# Patient Record
Sex: Female | Born: 1975 | Race: White | Hispanic: No | Marital: Married
Health system: Southern US, Community
[De-identification: ages and names within clinical notes are randomized; demographics above are authoritative.]

---

## 2005-12-07 ENCOUNTER — Encounter: Admission: RE | Admit: 2005-12-07 | Discharge: 2005-12-07 | Payer: Self-pay | Admitting: Family Medicine

## 2006-08-29 ENCOUNTER — Ambulatory Visit (HOSPITAL_COMMUNITY): Admission: RE | Admit: 2006-08-29 | Discharge: 2006-08-29 | Payer: Self-pay | Admitting: Neurological Surgery

## 2007-04-22 ENCOUNTER — Ambulatory Visit (HOSPITAL_COMMUNITY): Admission: RE | Admit: 2007-04-22 | Discharge: 2007-04-22 | Payer: Self-pay | Admitting: Surgery

## 2007-04-30 ENCOUNTER — Encounter: Admission: RE | Admit: 2007-04-30 | Discharge: 2007-04-30 | Payer: Self-pay | Admitting: Surgery

## 2007-05-08 ENCOUNTER — Ambulatory Visit (HOSPITAL_COMMUNITY): Admission: RE | Admit: 2007-05-08 | Discharge: 2007-05-08 | Payer: Self-pay | Admitting: Surgery

## 2007-07-08 ENCOUNTER — Ambulatory Visit (HOSPITAL_COMMUNITY): Admission: RE | Admit: 2007-07-08 | Discharge: 2007-07-08 | Payer: Self-pay | Admitting: Surgery

## 2007-08-20 ENCOUNTER — Encounter: Admission: RE | Admit: 2007-08-20 | Discharge: 2007-08-20 | Payer: Self-pay | Admitting: Surgery

## 2007-09-03 ENCOUNTER — Ambulatory Visit (HOSPITAL_COMMUNITY): Admission: RE | Admit: 2007-09-03 | Discharge: 2007-09-04 | Payer: Self-pay | Admitting: Surgery

## 2007-09-17 ENCOUNTER — Encounter: Admission: RE | Admit: 2007-09-17 | Discharge: 2007-09-17 | Payer: Self-pay | Admitting: Surgery

## 2009-07-02 ENCOUNTER — Encounter: Admission: RE | Admit: 2009-07-02 | Discharge: 2009-07-02 | Payer: Self-pay | Admitting: Surgery

## 2010-04-04 ENCOUNTER — Inpatient Hospital Stay (HOSPITAL_COMMUNITY)
Admission: EM | Admit: 2010-04-04 | Discharge: 2010-04-08 | Payer: Self-pay | Source: Home / Self Care | Attending: Internal Medicine | Admitting: Internal Medicine

## 2010-04-06 ENCOUNTER — Encounter: Payer: Self-pay | Admitting: Internal Medicine

## 2010-04-08 ENCOUNTER — Encounter (INDEPENDENT_AMBULATORY_CARE_PROVIDER_SITE_OTHER): Payer: Self-pay | Admitting: *Deleted

## 2010-04-20 ENCOUNTER — Ambulatory Visit: Payer: Self-pay | Admitting: Gastroenterology

## 2010-04-20 ENCOUNTER — Encounter (INDEPENDENT_AMBULATORY_CARE_PROVIDER_SITE_OTHER): Payer: Self-pay | Admitting: *Deleted

## 2010-05-31 NOTE — Letter (Signed)
Summary: New Patient letter  [facility]  [address] [place], [place] [address]   Phone: [phone]  Fax: [phone]       04/08/2010 MRN: [id number]  [facility] [address] [place], [place]  [address]  [place]  Dear Ms. Larsh,  Welcome to the Gastroenterology Division at [facility].    You are scheduled to see Willette Cluster on 04-20-10 at 10:00a.m. on the 3rd floor at [facility], [address]  We ask that you try to arrive at our office 15 minutes prior to your appointment time to allow for check-in.  We would like you to complete the enclosed self-administered evaluation form prior to your visit and bring it with you on the day of your appointment.  We will review it with you.  Also, please bring a complete list of all your medications or, if you prefer, bring the medication bottles and we will list them.  Please bring your insurance card so that we may make a copy of it.  If your insurance requires a referral to see a specialist, please bring your referral form from your primary care physician.  Co-payments are due at the time of your visit and may be paid by cash, check or credit card.     Your office visit will consist of a consult with your physician (includes a physical exam), any laboratory testing he/she may order, scheduling of any necessary diagnostic testing (e.g. x-ray, ultrasound, CT-scan), and scheduling of a procedure (e.g. Endoscopy, Colonoscopy) if required.  Please allow enough time on your schedule to allow for any/all of these possibilities.    If you cannot keep your appointment, please call [phone] to cancel or reschedule prior to your appointment date.  This allows [place] the opportunity to schedule an appointment for another patient in need of care.  If you do not cancel or reschedule by 5 p.m. the business day prior to your appointment date, you will be charged a $50.00 late cancellation/no-show fee.    Thank you for choosing  [facility] for your medical needs.  We appreciate the opportunity to care for you.  Please visit [place] at our website  to learn more about our practice.                     Sincerely,                                                             The Gastroenterology Division

## 2010-05-31 NOTE — Procedures (Signed)
Summary: Upper Endoscopy  Patient: [name] Note: All result statuses are Final unless otherwise noted.  Tests: (1) Upper Endoscopy (EGD)   EGD Upper Endoscopy       DONE     [facility]     [address] [place], [place]  [address]           ENDOSCOPY PROCEDURE REPORT           PATIENT:  [name], Andrea  MR#:  [id number]     BIRTHDATE:  1975-07-01, 34 yrs. old  GENDER:  female           ENDOSCOPIST:  Wilhemina Bonito. Eda Keys, MD     Referred by:  Pearson Grippe, M.D.           PROCEDURE DATE:  04/06/2010     PROCEDURE:  EGD with biopsy, 43239     ASA CLASS:  Class II     INDICATIONS:  abdominal pain, nausea and vomiting           MEDICATIONS:   Fentanyl 50 mcg IV, Versed 5 mg IV     TOPICAL ANESTHETIC:  Cetacaine Spray           DESCRIPTION OF PROCEDURE:   After the risks benefits and     alternatives of the procedure were thoroughly explained, informed     consent was obtained.  The Pentax EG-[id number] peds [id number] endoscope     was introduced through the mouth and advanced to the second     portion of the duodenum, without limitations.  The instrument was     slowly withdrawn as the mucosa was fully examined.     <<PROCEDUREIMAGES>>           The upper, middle, and distal third of the esophagus were     carefully inspected and no abnormalities were noted. The z-line     was well seen at the GEJ. The endoscope was pushed into the fundus     which was normal including a retroflexed view. The antrum,gastric     body, first and second part of the duodenum were unremarkable.     S/P lap band procedure w/o apparrent complication.    Retroflexed     views revealed s/p lap band procedure.    The scope was then     withdrawn from the patient and the procedure completed.           COMPLICATIONS:  None           ENDOSCOPIC IMPRESSION:     1) Normal EGD     2) S/p lap band procedure     3) No cause for pain found     RECOMMENDATIONS:     1) Return to hospital     2) Give  running Zofran for nausea     3) Advance diet as tolerated           _____________________________     Wilhemina Bonito. Eda Keys, MD           CC:  Pearson Grippe, MD; The Patient           n.     eSIGNED:   Wilhemina Bonito. Eda Keys at 04/06/2010 04:50 PM           [name], [id number]  Note: An exclamation mark (!) indicates a result that was not dispersed into the flowsheet. Document Creation Date: 04/06/2010 5:05 PM _______________________________________________________________________  (1)  Order result status: Final Collection or observation date-time: 04/06/2010 16:39 Requested date-time:  Receipt date-time:  Reported date-time:  Referring Physician:   Ordering Physician: Fransico Setters [phone]) Specimen Source:  Source: Launa Grill Order Number: [id number] Lab site:

## 2010-07-12 LAB — BASIC METABOLIC PANEL
BUN: 3 mg/dL — ABNORMAL LOW (ref 6–23)
CO2: 26 mEq/L (ref 19–32)
Chloride: 106 mEq/L (ref 96–112)
Chloride: 106 mEq/L (ref 96–112)
GFR calc Af Amer: >60 mL/min (ref >60)
Potassium: 3.5 mEq/L (ref 3.5–5.1)
Potassium: 3.9 mEq/L (ref 3.5–5.1)
Sodium: 137 mEq/L (ref 135–145)
Sodium: 138 mEq/L (ref 135–145)

## 2010-07-12 LAB — HEPATIC FUNCTION PANEL
ALT: 44 U/L — ABNORMAL HIGH (ref 0–35)
Albumin: 2.9 g/dL — ABNORMAL LOW (ref 3.5–5.2)
Alkaline Phosphatase: 67 U/L (ref 39–117)
Total Protein: 5.8 g/dL — ABNORMAL LOW (ref 6.0–8.3)

## 2010-07-12 LAB — COMPREHENSIVE METABOLIC PANEL
ALT: 49 U/L — ABNORMAL HIGH (ref 0–35)
AST: 149 U/L — ABNORMAL HIGH (ref 0–37)
AST: 61 U/L — ABNORMAL HIGH (ref 0–37)
Albumin: 3.7 g/dL (ref 3.5–5.2)
BUN: 5 mg/dL — ABNORMAL LOW (ref 6–23)
CO2: 19 mEq/L (ref 19–32)
Chloride: 110 mEq/L (ref 96–112)
Glucose, Bld: 92 mg/dL (ref 70–99)
Potassium: 3.8 mEq/L (ref 3.5–5.1)
Sodium: 140 mEq/L (ref 135–145)
Sodium: 141 mEq/L (ref 135–145)
Total Bilirubin: 0.9 mg/dL (ref 0.3–1.2)
Total Protein: 6.8 g/dL (ref 6.0–8.3)

## 2010-07-12 LAB — GASTRIN: Gastrin: 118 pg/mL — ABNORMAL HIGH (ref <101)

## 2010-07-12 LAB — URINALYSIS, ROUTINE W REFLEX MICROSCOPIC
Glucose, UA: NEGATIVE mg/dL
Hgb urine dipstick: NEGATIVE
Ketones, ur: NEGATIVE mg/dL
Nitrite: NEGATIVE
Protein, ur: NEGATIVE mg/dL
Specific Gravity, Urine: >1.046 — ABNORMAL HIGH (ref 1.005–1.030)
Urobilinogen, UA: 0.2 mg/dL (ref 0.0–1.0)
pH: 6.5 (ref 5.0–8.0)

## 2010-07-12 LAB — CBC
HCT: 35.3 % — ABNORMAL LOW (ref 36.0–46.0)
HCT: 36.5 % (ref 36.0–46.0)
HCT: 37 % (ref 36.0–46.0)
Hemoglobin: 11.9 g/dL — ABNORMAL LOW (ref 12.0–15.0)
Hemoglobin: 12.1 g/dL (ref 12.0–15.0)
Hemoglobin: 12.5 g/dL (ref 12.0–15.0)
Hemoglobin: 13.8 g/dL (ref 12.0–15.0)
MCH: 29.6 pg (ref 26.0–34.0)
MCH: 29.9 pg (ref 26.0–34.0)
MCHC: 33.8 g/dL (ref 30.0–36.0)
MCV: 87.8 fL (ref 78.0–100.0)
MCV: 88 fL (ref 78.0–100.0)
MCV: 88.1 fL (ref 78.0–100.0)
MCV: 88.7 fL (ref 78.0–100.0)
Platelets: 199 10*3/uL (ref 150–400)
RBC: 4.02 MIL/uL (ref 3.87–5.11)
RBC: 4.15 MIL/uL (ref 3.87–5.11)
RDW: 12.9 % (ref 11.5–15.5)
RDW: 13.1 % (ref 11.5–15.5)
RDW: 13.2 % (ref 11.5–15.5)
WBC: 4.5 10*3/uL (ref 4.0–10.5)
WBC: 5.2 10*3/uL (ref 4.0–10.5)
WBC: 5.3 10*3/uL (ref 4.0–10.5)

## 2010-07-12 LAB — RAPID URINE DRUG SCREEN, HOSP PERFORMED
Amphetamines: NOT DETECTED
Benzodiazepines: POSITIVE — AB
Cocaine: NOT DETECTED

## 2010-07-12 LAB — DIFFERENTIAL
Basophils Absolute: 0 10*3/uL (ref 0.0–0.1)
Basophils Absolute: 0 10*3/uL (ref 0.0–0.1)
Basophils Relative: 0 % (ref 0–1)
Eosinophils Relative: 0 % (ref 0–5)
Lymphocytes Relative: 27 % (ref 12–46)
Lymphs Abs: 1.4 10*3/uL (ref 0.7–4.0)
Monocytes Absolute: 0.3 10*3/uL (ref 0.1–1.0)
Monocytes Absolute: 0.5 10*3/uL (ref 0.1–1.0)
Monocytes Relative: 7 % (ref 3–12)
Monocytes Relative: 7 % (ref 3–12)
Neutro Abs: 3.4 10*3/uL (ref 1.7–7.7)
Neutrophils Relative %: 77 % (ref 43–77)

## 2010-07-12 LAB — FECAL LACTOFERRIN, QUANT

## 2010-07-12 LAB — CARDIAC PANEL(CRET KIN+CKTOT+MB+TROPI): CK, MB: 0.3 ng/mL (ref 0.3–4.0)

## 2010-07-12 LAB — HEPATITIS PANEL, ACUTE: Hep A IgM: NEGATIVE

## 2010-07-12 LAB — URINE MICROSCOPIC-ADD ON

## 2010-07-12 LAB — D-DIMER, QUANTITATIVE: D-Dimer, Quant: 0.72 ug/mL-FEU — ABNORMAL HIGH (ref 0.00–0.48)

## 2010-07-12 LAB — HIV ANTIBODY (ROUTINE TESTING W REFLEX): HIV: NONREACTIVE

## 2010-07-12 LAB — C-REACTIVE PROTEIN: CRP: 4.7 mg/dL — ABNORMAL HIGH (ref <0.6)

## 2010-07-12 LAB — MRSA PCR SCREENING: MRSA by PCR: NEGATIVE

## 2010-09-13 NOTE — Op Note (Signed)
Andrea [name], Andrea [name]                 ACCOUNT NO.:  [id number]   MEDICAL RECORD NO.:  [id number]          PATIENT TYPE:  AMB   LOCATION:  DAY                          FACILITY:  [facility]   PHYSICIAN:  Thornton Park. Daphine Deutscher, MD  DATE OF BIRTH:  03/13/1976   DATE OF PROCEDURE:  09/03/2007  DATE OF DISCHARGE:                               OPERATIVE REPORT   PREOPERATIVE DIAGNOSIS:  [age]-year-old prior left nephrectomy  living-related kidney donor who has a BMI approximately 42 and multiple  comorbidities, for laparoscopic band.   POSTOPERATIVE DIAGNOSIS:  [age]-year-old prior left nephrectomy  living-related kidney donor who has a BMI approximately 42 and multiple  comorbidities, for laparoscopic band.   PROCEDURE:  Laparoscopic placement of adjustable Allergan APS system  band.   SURGEON:  Thornton Park. Daphine Deutscher, MD   ASSISTANT:  Sandria Bales. Ezzard Standing, M.D.   ANESTHESIA:  General endotracheal.   DESCRIPTION OF PROCEDURE:  Milley Vining is a 35 year old white female  taken to room #1 on May 5 and given general anesthesia.  The abdomen was  prepped with Techni-Care and draped sterilely.  I initially attempted to  enter the abdomen through the upper midline with 5.0-degree OptiView,  but then fell back to going to the left upper quadrant, which I did  without difficulty.  I then took down some adhesions to her previous  incisional hernia repair and that gave me enough room to put the band  in.  I put a standard trocar placement with a 15 in the upper position  on the right and then an 11 below that and used an angled 10 scope.  Dissection proximally was unremarkable.  The Blackgum gave [place] good  retraction.  Upper GI was done preop and showed no hiatal hernia and we  did not see anything to suggest that at this time.  We passed the  balloon-tip catheter and used that for sizing.  The band passer was  passed around behind the proximal stomach using the pars flaccida  technique.  First, I  identified a target site over on the left side near  the hiatus/crus and then went along the lower border of the right crus  near the fat stripe and passed the band passer.  I then connected the  band, brought it around and snapped it in the buckle.  I then put this  over the sizing tubing and it seemed to fit nicely.   The tubing was removed and then I plicated the band with 3 sutures of  Surgidac and tie knots.  I then did a free tie in a 2-0 silk to do an  anti-slip stitch along the medial lesser curvature side where the band  was.  The band looked good.  The abdomen was deflated the band tubing  was brought out through the lower port on the right and a port was  created there.  This was connected and sewn in place with four 2-0  Prolene.  Wounds were closed 4-0 Vicryl with Benzoin and Steri-Strips.  The patient seemed to tolerate the procedure well and was taken to  recovery room in satisfactory condition.      Thornton Park Daphine Deutscher, MD  Electronically Signed     MBM/MEDQ  D:  09/03/2007  T:  09/03/2007  Job:  [id number]   cc:   [facility], [place], [place] Cox, Belenda Cruise MD

## 2010-09-16 NOTE — Op Note (Signed)
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   PROCEDURES:  Left lumbar hemilaminectomy, medial facetectomy,  foraminotomy at L5-S1, followed by microdiskectomy at L5-S1 on the left  utilizing microscopic dissection.   SURGEON:  Tia Alert, MD   ASSISTANT:  Donalee Citrin, MD   ANESTHESIA:  General endotracheal.   COMPLICATIONS:  None apparent.   INDICATIONS FOR PROCEDURE:  Ms. Chavous is a 35 year old female who is  referred with severe left leg pain.  She had an MRI which showed a  sizable disk herniation at L5-S1 on the left side.  She had tried  medical management for quite some time without significant relief.  I  recommended a microdiskectomy at L5-S1 on the left side.  She understood  the risks, benefits and expected outcome and wished to proceed.   DESCRIPTION OF PROCEDURE:  The patient was taken to the operating room  and after induction of adequate generalized endotracheal anesthesia, she  was rolled into a prone position on the Wilson frame and all pressure  points were padded.  Her lumbar region was prepped with DuraPrep and  then draped in the usual sterile fashion.  Five milliliters of local  anesthesia was injected and a small dorsal midline incision was made and  carried down to the lumbosacral fascia.  The fascia was opened on the  left side and taken down in a subperiosteal fashion to expose L5-S1 on  the left side.  Intraoperative x-ray confirmed my level and the Kerrison  punch was used to  perform a hemilaminectomy, medial facetectomy and  foraminotomy at L5-S1 on the left side.  The yellow ligament was opened  and removed in a piecemeal fashion to expose the underlying dura and S1  nerve root.  The S1 nerve root was retracted medially and a large  subannular disk herniation was identified.  The operating microscope was  brought into the field.  The annulus was incised and the initial  diskectomy was done with pituitary rongeurs and curved curettes.  There  was a spondylitic ridge associated with this, which was brought down  into the disk space with an Epstein curette and then removed with  pituitary rongeurs.  A thorough intradiskal diskectomy was performed.  I  then palpated with a coronary dilator into the foramen and into the  midline to widen my foraminotomy somewhat to assure adequate  decompression of the nerve root.  The nerve root sat freely and was  pulsatile.  I felt no more compressive lesions.  I then irrigated with  saline solution containing bacitracin, inspected  my nerve root once  again, lined the dura with Gelfoam, removed the retractor, inspected for  any bleeding points and then closed the fascia with 0 Vicryl, closed the  subcutaneous and subcuticular tissues in layers of 2-0 and 3-0 Vicryl  and closed the skin with  Benzoin and Steri-Strips.  The drapes were removed and a sterile  dressing was applied.  The patient was awakened from general anesthesia  and transported to the recovery room in stable condition.  At the end of  the procedure all sponge, needle and instrument counts were correct.      Tia Alert, MD  Electronically Signed     DSJ/MEDQ  D:  08/29/2006  T:  08/29/2006  Job:  [id number]

## 2011-03-07 ENCOUNTER — Telehealth (INDEPENDENT_AMBULATORY_CARE_PROVIDER_SITE_OTHER): Payer: Self-pay | Admitting: Surgery

## 2011-03-07 NOTE — Telephone Encounter (Signed)
03/07/11 recall mailed for bariatric surgery follow-up. Adv pt to call [facility] to schedule an appt...cef °

## 2012-05-02 ENCOUNTER — Telehealth (INDEPENDENT_AMBULATORY_CARE_PROVIDER_SITE_OTHER): Payer: Self-pay | Admitting: Surgery

## 2012-05-02 NOTE — Telephone Encounter (Signed)
03/27/12 mailed recall letter for bariatric surgery follow-up to pt. Advised pt to call [facility] at [phone] to °schedule appt. (lss) ° °

## 2013-05-21 ENCOUNTER — Other Ambulatory Visit: Payer: Self-pay | Admitting: Neurological Surgery

## 2013-05-21 DIAGNOSIS — M5416 Radiculopathy, lumbar region: Secondary | ICD-10-CM

## 2013-05-22 ENCOUNTER — Ambulatory Visit
Admission: RE | Admit: 2013-05-22 | Discharge: 2013-05-22 | Disposition: A | Discharge status: Home / Self Care | Payer: BC Managed Care – PPO | Source: Ambulatory Visit | Attending: Neurological Surgery | Admitting: Neurological Surgery

## 2013-05-22 DIAGNOSIS — M5416 Radiculopathy, lumbar region: Secondary | ICD-10-CM

## 2013-05-22 MED ORDER — METHYLPREDNISOLONE ACETATE 40 MG/ML INJ SUSP (RADIOLOG
120.0000 mg | Freq: Once | INTRAMUSCULAR | Status: AC
  Administered 2013-05-22: 120 mg via EPIDURAL

## 2013-05-22 MED ORDER — IOHEXOL 180 MG/ML  SOLN
1.0000 mL | Freq: Once | INTRAMUSCULAR | Status: AC | PRN
  Administered 2013-05-22: 1 mL via EPIDURAL

## 2013-05-22 NOTE — Discharge Instructions (Signed)

## 2016-04-04 ENCOUNTER — Encounter (HOSPITAL_COMMUNITY): Payer: Self-pay

## 2017-04-05 ENCOUNTER — Encounter (HOSPITAL_COMMUNITY): Payer: Self-pay

## 2017-05-14 DIAGNOSIS — R928 Other abnormal and inconclusive findings on diagnostic imaging of breast: Secondary | ICD-10-CM | POA: Diagnosis not present

## 2017-05-14 DIAGNOSIS — N6489 Other specified disorders of breast: Secondary | ICD-10-CM | POA: Diagnosis not present

## 2017-06-01 DIAGNOSIS — M545 Low back pain: Secondary | ICD-10-CM | POA: Diagnosis not present

## 2017-06-01 DIAGNOSIS — R0602 Shortness of breath: Secondary | ICD-10-CM | POA: Diagnosis not present

## 2017-06-01 DIAGNOSIS — R32 Unspecified urinary incontinence: Secondary | ICD-10-CM | POA: Diagnosis not present

## 2017-06-01 DIAGNOSIS — R112 Nausea with vomiting, unspecified: Secondary | ICD-10-CM | POA: Diagnosis not present

## 2017-06-01 DIAGNOSIS — R197 Diarrhea, unspecified: Secondary | ICD-10-CM | POA: Diagnosis not present

## 2017-06-01 DIAGNOSIS — M79602 Pain in left arm: Secondary | ICD-10-CM | POA: Diagnosis not present

## 2017-06-01 DIAGNOSIS — R5381 Other malaise: Secondary | ICD-10-CM | POA: Diagnosis not present

## 2017-06-01 DIAGNOSIS — F17210 Nicotine dependence, cigarettes, uncomplicated: Secondary | ICD-10-CM | POA: Diagnosis not present

## 2017-06-01 DIAGNOSIS — G43801 Other migraine, not intractable, with status migrainosus: Secondary | ICD-10-CM | POA: Diagnosis not present

## 2017-06-01 DIAGNOSIS — R509 Fever, unspecified: Secondary | ICD-10-CM | POA: Diagnosis not present

## 2017-06-01 DIAGNOSIS — R05 Cough: Secondary | ICD-10-CM | POA: Diagnosis not present

## 2017-06-01 DIAGNOSIS — Z885 Allergy status to narcotic agent status: Secondary | ICD-10-CM | POA: Diagnosis not present

## 2017-06-02 DIAGNOSIS — G43801 Other migraine, not intractable, with status migrainosus: Secondary | ICD-10-CM | POA: Diagnosis not present

## 2017-11-14 DIAGNOSIS — E668 Other obesity: Secondary | ICD-10-CM | POA: Diagnosis not present

## 2017-11-14 DIAGNOSIS — K760 Fatty (change of) liver, not elsewhere classified: Secondary | ICD-10-CM | POA: Diagnosis not present

## 2017-11-14 DIAGNOSIS — Z800 Family history of malignant neoplasm of digestive organs: Secondary | ICD-10-CM | POA: Diagnosis not present

## 2017-11-14 DIAGNOSIS — Z1389 Encounter for screening for other disorder: Secondary | ICD-10-CM | POA: Diagnosis not present

## 2017-11-14 DIAGNOSIS — Z9884 Bariatric surgery status: Secondary | ICD-10-CM | POA: Diagnosis not present

## 2017-12-25 DIAGNOSIS — K5901 Slow transit constipation: Secondary | ICD-10-CM | POA: Diagnosis not present

## 2017-12-25 DIAGNOSIS — R1084 Generalized abdominal pain: Secondary | ICD-10-CM | POA: Diagnosis not present

## 2017-12-27 ENCOUNTER — Ambulatory Visit
Admission: RE | Admit: 2017-12-27 | Discharge: 2017-12-27 | Disposition: A | Discharge status: Home / Self Care | Payer: BLUE CROSS/BLUE SHIELD | Source: Ambulatory Visit | Attending: Gastroenterology | Admitting: Gastroenterology

## 2017-12-27 ENCOUNTER — Other Ambulatory Visit: Payer: Self-pay | Admitting: Gastroenterology

## 2017-12-27 DIAGNOSIS — R109 Unspecified abdominal pain: Secondary | ICD-10-CM | POA: Diagnosis not present

## 2017-12-27 DIAGNOSIS — K5900 Constipation, unspecified: Secondary | ICD-10-CM

## 2018-01-01 ENCOUNTER — Ambulatory Visit
Admission: RE | Admit: 2018-01-01 | Discharge: 2018-01-01 | Disposition: A | Discharge status: Home / Self Care | Payer: BLUE CROSS/BLUE SHIELD | Source: Ambulatory Visit | Attending: Gastroenterology | Admitting: Gastroenterology

## 2018-01-01 ENCOUNTER — Other Ambulatory Visit: Payer: Self-pay | Admitting: Gastroenterology

## 2018-01-01 DIAGNOSIS — Z036 Encounter for observation for suspected toxic effect from ingested substance ruled out: Secondary | ICD-10-CM | POA: Diagnosis not present

## 2018-01-01 DIAGNOSIS — K5909 Other constipation: Secondary | ICD-10-CM

## 2018-03-05 DIAGNOSIS — Z9884 Bariatric surgery status: Secondary | ICD-10-CM | POA: Diagnosis not present

## 2018-03-05 DIAGNOSIS — R112 Nausea with vomiting, unspecified: Secondary | ICD-10-CM | POA: Diagnosis not present

## 2018-03-05 DIAGNOSIS — Z4651 Encounter for fitting and adjustment of gastric lap band: Secondary | ICD-10-CM | POA: Diagnosis not present

## 2018-03-06 ENCOUNTER — Other Ambulatory Visit: Payer: Self-pay | Admitting: Student

## 2018-03-06 ENCOUNTER — Telehealth: Payer: Self-pay | Admitting: Gastroenterology

## 2018-03-06 DIAGNOSIS — R112 Nausea with vomiting, unspecified: Secondary | ICD-10-CM

## 2018-03-06 DIAGNOSIS — R109 Unspecified abdominal pain: Secondary | ICD-10-CM | POA: Diagnosis not present

## 2018-03-06 NOTE — Telephone Encounter (Signed)
Hi Dr. Barron Alvine, we have received a referral from PCP for pt to be evaluated for constipation and other gi issues. Pt saw Dr. Matthias Hughs at [facility] this past June. Records have been received and will be sent for your review. Thank you.

## 2018-03-07 ENCOUNTER — Other Ambulatory Visit: Payer: Self-pay | Admitting: Student

## 2018-03-07 ENCOUNTER — Ambulatory Visit
Admission: RE | Admit: 2018-03-07 | Discharge: 2018-03-07 | Disposition: A | Discharge status: Home / Self Care | Payer: BLUE CROSS/BLUE SHIELD | Source: Ambulatory Visit | Attending: Student | Admitting: Student

## 2018-03-07 DIAGNOSIS — R112 Nausea with vomiting, unspecified: Secondary | ICD-10-CM | POA: Diagnosis not present

## 2018-03-08 ENCOUNTER — Encounter: Payer: Self-pay | Admitting: Gastroenterology

## 2018-03-08 NOTE — Telephone Encounter (Signed)
Left message for patient notifying her of this and to call back and schedule an office visit with him.

## 2018-03-20 ENCOUNTER — Ambulatory Visit: Payer: BLUE CROSS/BLUE SHIELD | Admitting: Gastroenterology

## 2019-01-22 DIAGNOSIS — M545 Low back pain: Secondary | ICD-10-CM | POA: Diagnosis not present

## 2019-01-22 DIAGNOSIS — M5440 Lumbago with sciatica, unspecified side: Secondary | ICD-10-CM | POA: Diagnosis not present

## 2019-02-13 IMAGING — DX DG ABDOMEN 1V
3 series · 3 of 3 positions shown · non-contrast
Comparison: 12/27/2017

CLINICAL DATA: Ingested Sitz marker capsule

EXAM:
ABDOMEN - 1 VIEW

[dg abd 1 view (1 of 3)]
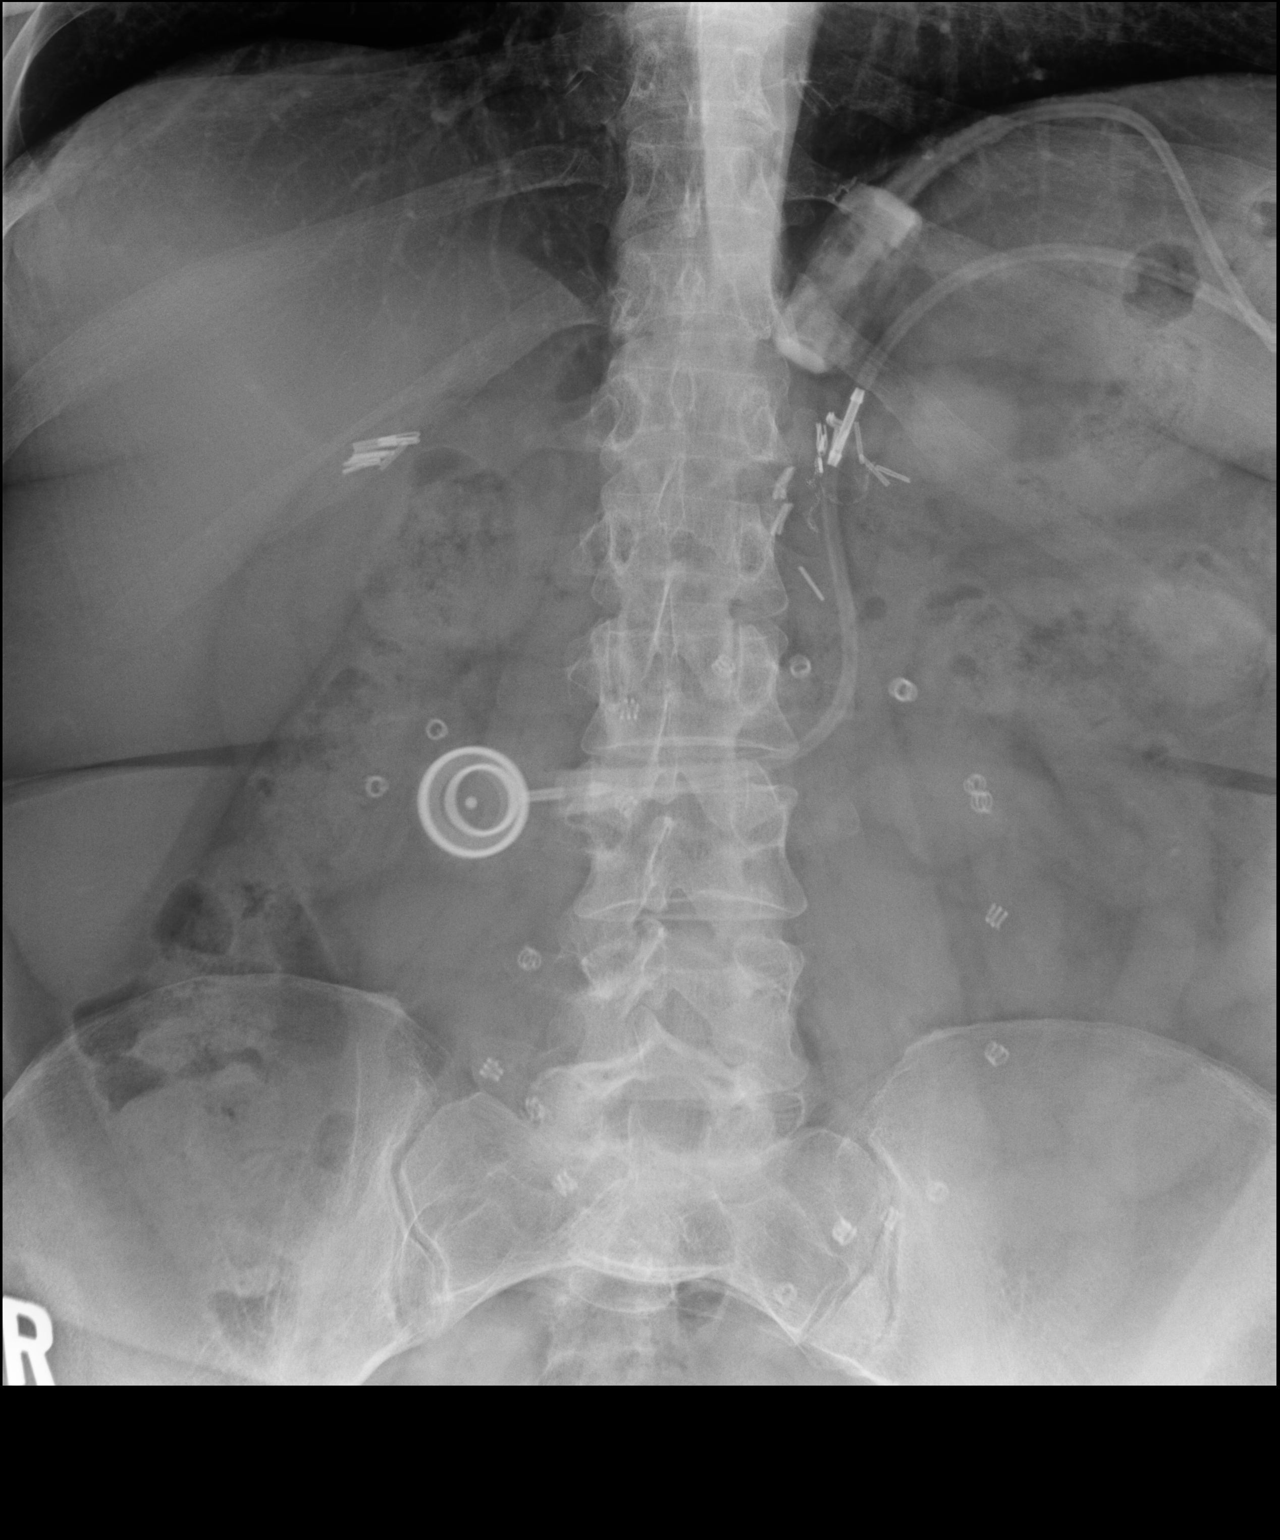

[dg abd 1 view (2 of 3)]
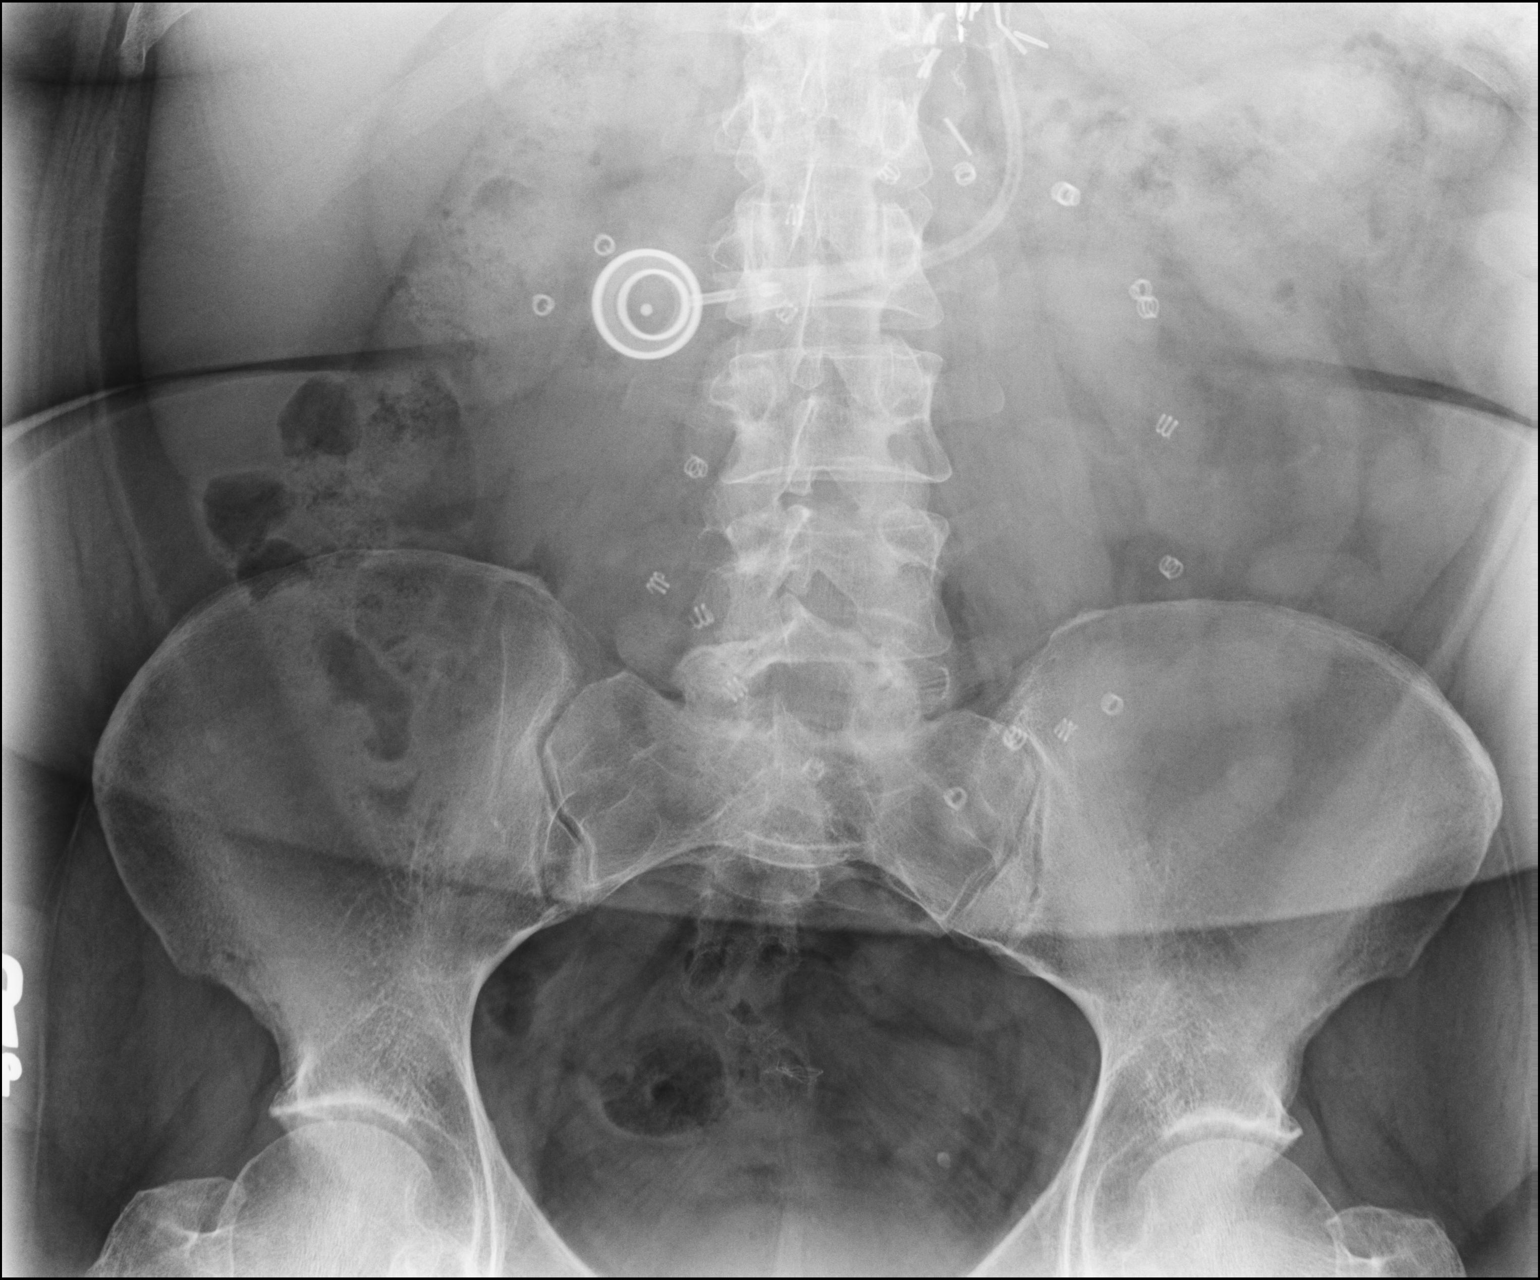

[dg abd 1 view (3 of 3)]
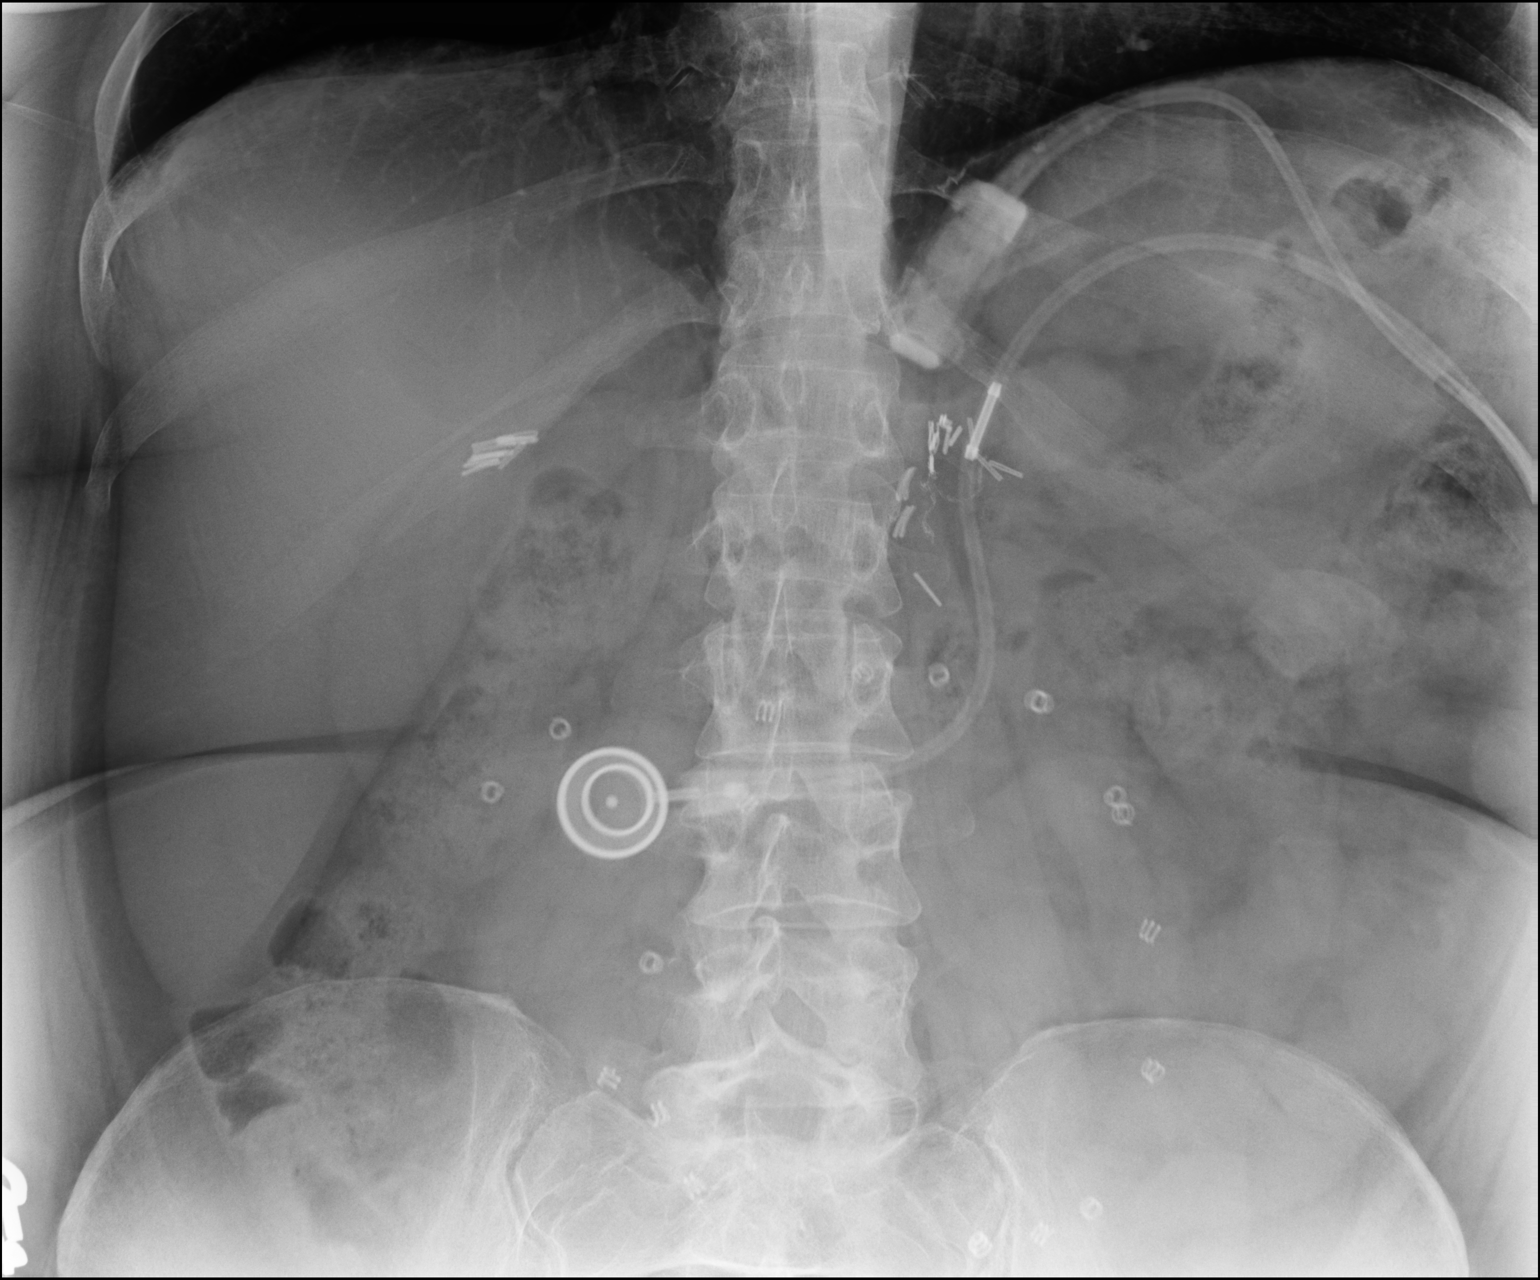

[3 of 3 positions shown; findings below may reference images not displayed]

FINDINGS: Multiple coils from prior ventral hernia repair.

Prior laparoscopic gastric banding.

Sitz marker rings seen on the previous exam are no longer
identified.

Normal bowel gas pattern.

Normal retained stool burden.

Osseous structures unremarkable.

No urinary tract calcifications.
IMPRESSION: Interval clearance of the Sitz marker rings since the previous
study.
